# Patient Record
Sex: Male | Born: 1975 | Race: Black or African American | Hispanic: No | Marital: Single | Smoking: Current every day smoker
Health system: Southern US, Community
[De-identification: ages and names within clinical notes are randomized; demographics above are authoritative.]

## PROBLEM LIST (undated history)

## (undated) DIAGNOSIS — J45909 Unspecified asthma, uncomplicated: Secondary | ICD-10-CM

---

## 2015-04-17 DIAGNOSIS — J45909 Unspecified asthma, uncomplicated: Secondary | ICD-10-CM | POA: Insufficient documentation

## 2015-11-12 ENCOUNTER — Emergency Department (HOSPITAL_COMMUNITY)
Admission: EM | Admit: 2015-11-12 | Discharge: 2015-11-12 | Disposition: A | Discharge status: Home / Self Care | Payer: Self-pay | Attending: Emergency Medicine | Admitting: Emergency Medicine

## 2015-11-12 ENCOUNTER — Encounter (HOSPITAL_COMMUNITY): Payer: Self-pay | Admitting: *Deleted

## 2015-11-12 ENCOUNTER — Emergency Department (HOSPITAL_COMMUNITY): Payer: Self-pay

## 2015-11-12 DIAGNOSIS — W268XXA Contact with other sharp object(s), not elsewhere classified, initial encounter: Secondary | ICD-10-CM | POA: Insufficient documentation

## 2015-11-12 DIAGNOSIS — J45909 Unspecified asthma, uncomplicated: Secondary | ICD-10-CM | POA: Insufficient documentation

## 2015-11-12 DIAGNOSIS — S68129A Partial traumatic metacarpophalangeal amputation of unspecified finger, initial encounter: Secondary | ICD-10-CM

## 2015-11-12 DIAGNOSIS — Z79899 Other long term (current) drug therapy: Secondary | ICD-10-CM | POA: Insufficient documentation

## 2015-11-12 DIAGNOSIS — S68121A Partial traumatic metacarpophalangeal amputation of left index finger, initial encounter: Secondary | ICD-10-CM | POA: Insufficient documentation

## 2015-11-12 DIAGNOSIS — Y929 Unspecified place or not applicable: Secondary | ICD-10-CM | POA: Insufficient documentation

## 2015-11-12 DIAGNOSIS — S68119A Complete traumatic metacarpophalangeal amputation of unspecified finger, initial encounter: Secondary | ICD-10-CM

## 2015-11-12 DIAGNOSIS — Z23 Encounter for immunization: Secondary | ICD-10-CM | POA: Insufficient documentation

## 2015-11-12 DIAGNOSIS — F17210 Nicotine dependence, cigarettes, uncomplicated: Secondary | ICD-10-CM | POA: Insufficient documentation

## 2015-11-12 DIAGNOSIS — F1290 Cannabis use, unspecified, uncomplicated: Secondary | ICD-10-CM | POA: Insufficient documentation

## 2015-11-12 DIAGNOSIS — Y939 Activity, unspecified: Secondary | ICD-10-CM | POA: Insufficient documentation

## 2015-11-12 DIAGNOSIS — Y999 Unspecified external cause status: Secondary | ICD-10-CM | POA: Insufficient documentation

## 2015-11-12 HISTORY — DX: Unspecified asthma, uncomplicated: J45.909

## 2015-11-12 MED ORDER — FENTANYL CITRATE (PF) 100 MCG/2ML IJ SOLN
50.0000 ug | Freq: Once | INTRAMUSCULAR | Status: AC
  Administered 2015-11-12: 50 ug via INTRAMUSCULAR
  Filled 2015-11-12: qty 2

## 2015-11-12 MED ORDER — HYDROGEN PEROXIDE 3 % EX SOLN
CUTANEOUS | Status: AC
  Filled 2015-11-12: qty 473

## 2015-11-12 MED ORDER — IBUPROFEN 800 MG PO TABS: 800.0000 mg | ORAL_TABLET | Freq: Three times a day (TID) | ORAL | 0 refills | Status: AC

## 2015-11-12 MED ORDER — OXYCODONE-ACETAMINOPHEN 5-325 MG PO TABS: 1.0000 | ORAL_TABLET | Freq: Four times a day (QID) | ORAL | 0 refills | Status: DC | PRN

## 2015-11-12 MED ORDER — TETANUS-DIPHTH-ACELL PERTUSSIS 5-2.5-18.5 LF-MCG/0.5 IM SUSP
0.5000 mL | Freq: Once | INTRAMUSCULAR | Status: AC
  Administered 2015-11-12: 0.5 mL via INTRAMUSCULAR
  Filled 2015-11-12: qty 0.5

## 2015-11-12 NOTE — ED Notes (Signed)
Discharge instructions, follow up care, and rx x2 reviewed with patient. Patient verbalized understanding. 

## 2015-11-12 NOTE — ED Notes (Signed)
Patient's wound irrigated with normal saline

## 2015-11-12 NOTE — ED Triage Notes (Signed)
Pt reports getting his L index finger stuck in a metal broom holder-amputating the tip of his finger.

## 2015-11-12 NOTE — ED Notes (Signed)
Per PA, soak patient's finger in warm water and hydrogen peroxide.

## 2015-11-12 NOTE — ED Notes (Signed)
Xeroform dressing and gauze applied to wound.

## 2015-11-12 NOTE — ED Provider Notes (Signed)
[facility]-EMERGENCY DEPT Provider Note   CSN: [id number] Arrival date & time: 11/12/15  1015     History   Chief Complaint Chief Complaint  Patient presents with  . Finger Injury    HPI Jon Adkins is a 40 y.o. male.  HPI   Patient is a 30 abnormality past medical history of asthma, presents emergency department for evaluation of left index fingertip amputation which occurred less than one hour ago.  The injury occurred when his finger got stuck in a metal broom holder and he pulled it causing it to cut the tip of his finger off. The cut is through his nail. He wrapped it up and pepper towels and came to the ER. He does not know his last tetanus shot.  He reports severe pain, 10/10 worse with movement and palpation, Pain radiates into all of his fingers and up his arm to his elbow.  No medications or treatments prior to arrival.  He did bring the tip of his finger with fingernail into the ER and is placed on ice.  Past Medical History:  Diagnosis Date  . Asthma     There are no active problems to display for this patient.   History reviewed. No pertinent surgical history.     Home Medications    Prior to Admission medications   Medication Sig Start Date End Date Taking? Authorizing Provider  albuterol (PROVENTIL HFA;VENTOLIN HFA) 108 (90 Base) MCG/ACT inhaler Inhale 1-2 puffs into the lungs every 6 (six) hours as needed for wheezing or shortness of breath.   Yes Historical Provider, MD  ibuprofen (ADVIL,MOTRIN) 800 MG tablet Take 1 tablet (800 mg total) by mouth 3 (three) times daily. 11/12/15   Danelle BerryLeisa Leeah Politano, PA-C  oxyCODONE-acetaminophen (PERCOCET) 5-325 MG tablet Take 1-2 tablets by mouth every 6 (six) hours as needed for severe pain. 11/12/15   Danelle BerryLeisa Eduin Friedel, PA-C    Family History No family history on file.  Social History Social History  Substance Use Topics  . Smoking status: Current Every Day Smoker    Packs/day: 0.50    Types: Cigarettes  . Smokeless tobacco:  Never Used  . Alcohol use No     Allergies   Review of patient's allergies indicates no known allergies.   Review of Systems Review of Systems  All other systems reviewed and are negative.    Physical Exam Updated Vital Signs BP (!) 130/112 (BP Location: Right Arm)   Pulse 64   Temp 98.5 F (36.9 C) (Oral)   Resp 18   Ht 5\' 7"  (1.702 m)   Wt 90.7 kg   SpO2 100%   BMI 31.32 kg/m   Physical Exam  Constitutional: He is oriented to person, place, and time. He appears well-developed and well-nourished. No distress.  HENT:  Head: Normocephalic and atraumatic.  Right Ear: External ear normal.  Left Ear: External ear normal.  Nose: Nose normal.  Mouth/Throat: Oropharyngeal exudate present.  Eyes: Conjunctivae are normal. Pupils are equal, round, and reactive to light. Right eye exhibits no discharge. Left eye exhibits no discharge. No scleral icterus.  Neck: Normal range of motion. Neck supple. No tracheal deviation present.  Cardiovascular: Normal rate, regular rhythm and intact distal pulses.   Pulmonary/Chest: Effort normal and breath sounds normal. No stridor. No respiratory distress.  Musculoskeletal: Normal range of motion. He exhibits tenderness. He exhibits no edema.       Left hand: He exhibits tenderness and laceration. He exhibits normal range of motion. Normal sensation noted.  Hands: Neurological: He is alert and oriented to person, place, and time. He exhibits normal muscle tone. Coordination normal.  Skin: Skin is warm and dry. No rash noted. He is not diaphoretic. No erythema. No pallor.  Psychiatric: He has a normal mood and affect. His behavior is normal. Judgment and thought content normal.  Nursing note and vitals reviewed.      ED Treatments / Results  Labs (all labs ordered are listed, but only abnormal results are displayed) Labs Reviewed - No data to display  EKG  EKG Interpretation None       Radiology Dg Finger Index  Left  Result Date: 11/12/2015 CLINICAL DATA:  Index finger pain following injury. Initial encounter. EXAM: LEFT INDEX FINGER 2+V COMPARISON:  None. FINDINGS: Degloving soft tissue injury noted distally, best seen on the lateral view. The distal phalanx appears exposed without definite fracture or bone destruction. There is no evidence of foreign body or soft tissue emphysema. IMPRESSION: Distal soft tissue degloving injury with suspected osseous exposure, predisposing to infection. No acute osseous findings evident. Electronically Signed   By: Carey BullocksWilliam  Veazey M.D.   On: 11/12/2015 10:46    Procedures Procedures (including critical care time)  Medications Ordered in ED Medications  hydrogen peroxide 3 % external solution (not administered)  fentaNYL (SUBLIMAZE) injection 50 mcg (50 mcg Intramuscular Given 11/12/15 1041)  Tdap (BOOSTRIX) injection 0.5 mL (0.5 mLs Intramuscular Given 11/12/15 1044)     Initial Impression / Assessment and Plan / ED Course  I have reviewed the triage vital signs and the nursing notes.  Pertinent labs & imaging results that were available during my care of the patient were reviewed by me and considered in my medical decision making (see chart for details).  Clinical Course   Patient with distal left index fingertip avulsion laceration (tip amputation) with partial nailbed involvement.  Case was discussed with Dr. Adela LankFloyd who has personally seen and evaluated the pt.  He agrees with secondary intention wound healing.  Did not see visible bone.  Xeroform dressing with gauze was applied.  Wound care was thoroughly reviewed with the pt, return precautions reviewed, pt verbalized understanding.  He will return for wound recheck.    Final Clinical Impressions(s) / ED Diagnoses   Final diagnoses:  Fingertip amputation, initial encounter    New Prescriptions Discharge Medication List as of 11/12/2015 12:00 PM    START taking these medications   Details  ibuprofen  (ADVIL,MOTRIN) 800 MG tablet Take 1 tablet (800 mg total) by mouth 3 (three) times daily., Starting Mon 11/12/2015, Print    oxyCODONE-acetaminophen (PERCOCET) 5-325 MG tablet Take 1-2 tablets by mouth every 6 (six) hours as needed for severe pain., Starting Mon 11/12/2015, Print         Danelle BerryLeisa Sahas Sluka, PA-C 11/12/15 1248    Melene Planan Floyd, DO 11/12/15 [phone]

## 2016-08-15 ENCOUNTER — Emergency Department (HOSPITAL_COMMUNITY)
Admission: EM | Admit: 2016-08-15 | Discharge: 2016-08-15 | Disposition: A | Discharge status: Home / Self Care | Payer: 59 | Attending: Emergency Medicine | Admitting: Emergency Medicine

## 2016-08-15 ENCOUNTER — Encounter (HOSPITAL_COMMUNITY): Payer: Self-pay

## 2016-08-15 DIAGNOSIS — J020 Streptococcal pharyngitis: Secondary | ICD-10-CM | POA: Diagnosis not present

## 2016-08-15 DIAGNOSIS — J45909 Unspecified asthma, uncomplicated: Secondary | ICD-10-CM | POA: Insufficient documentation

## 2016-08-15 DIAGNOSIS — J029 Acute pharyngitis, unspecified: Secondary | ICD-10-CM | POA: Diagnosis present

## 2016-08-15 DIAGNOSIS — F17200 Nicotine dependence, unspecified, uncomplicated: Secondary | ICD-10-CM | POA: Insufficient documentation

## 2016-08-15 LAB — RAPID STREP SCREEN (MED CTR MEBANE ONLY): STREPTOCOCCUS, GROUP A SCREEN (DIRECT): POSITIVE — AB

## 2016-08-15 MED ORDER — PENICILLIN G BENZATHINE 1200000 UNIT/2ML IM SUSP
1.2000 10*6.[IU] | Freq: Once | INTRAMUSCULAR | Status: AC
  Administered 2016-08-15: 1.2 10*6.[IU] via INTRAMUSCULAR
  Filled 2016-08-15: qty 2

## 2016-08-15 MED ORDER — IBUPROFEN 400 MG PO TABS
600.0000 mg | ORAL_TABLET | Freq: Once | ORAL | Status: AC
  Administered 2016-08-15: 600 mg via ORAL
  Filled 2016-08-15: qty 1

## 2016-08-15 MED ORDER — ACETAMINOPHEN 500 MG PO TABS
ORAL_TABLET | ORAL | Status: AC
  Filled 2016-08-15: qty 1

## 2016-08-15 MED ORDER — ACETAMINOPHEN 325 MG PO TABS
650.0000 mg | ORAL_TABLET | Freq: Once | ORAL | Status: AC | PRN
  Administered 2016-08-15: 650 mg via ORAL

## 2016-08-15 MED ORDER — DEXAMETHASONE SODIUM PHOSPHATE 10 MG/ML IJ SOLN
10.0000 mg | Freq: Once | INTRAMUSCULAR | Status: AC
  Administered 2016-08-15: 10 mg via INTRAMUSCULAR
  Filled 2016-08-15: qty 1

## 2016-08-15 MED ORDER — ACETAMINOPHEN 325 MG PO TABS
ORAL_TABLET | ORAL | Status: AC
  Filled 2016-08-15: qty 2

## 2016-08-15 MED ORDER — ALBUTEROL SULFATE HFA 108 (90 BASE) MCG/ACT IN AERS: 1.0000 | INHALATION_SPRAY | Freq: Four times a day (QID) | RESPIRATORY_TRACT | 0 refills | Status: AC | PRN

## 2016-08-15 MED ORDER — MAGIC MOUTHWASH W/LIDOCAINE: 10.0000 mL | Freq: Four times a day (QID) | ORAL | 0 refills | Status: AC | PRN

## 2016-08-15 NOTE — Discharge Instructions (Signed)
Rest and make sure you are drinking plenty of fluids.  Continue taking ibuprofen or tylenol for fever reduction and sore throat pain. Soft foods until your throat feels better.  You may use the magic mouthwash for temporary numbing of your tonsils which should also help you eat easier.

## 2016-08-15 NOTE — ED Provider Notes (Signed)
[facility]-EMERGENCY DEPT Provider Note   CSN: [id number] Arrival date & time: 08/15/16  1105     History   Chief Complaint Chief Complaint  Patient presents with  . Sore Throat    HPI Jon Adkins is a 41 y.o. male presenting with a 2 day history of sore throat, lymph node swelling,  Headache, generalized body aches and subjective fevers intermittent with chills. He has taken a generic nyquill type product which has helped him sleep the past 2 nights.  He describes poor oral intake x 2 days secondary to painful swallowing.  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 He denies abdominal pain, n/v/d, cough, sob, wheezing, no chest pain.  The history is provided by the patient and a parent.    Past Medical History:  Diagnosis Date  . Asthma     There are no active problems to display for this patient.   History reviewed. No pertinent surgical history.     Home Medications    Prior to Admission medications   Medication Sig Start Date End Date Taking? Authorizing Provider  albuterol (PROVENTIL HFA;VENTOLIN HFA) 108 (90 Base) MCG/ACT inhaler Inhale 1-2 puffs into the lungs every 6 (six) hours as needed for wheezing or shortness of breath.    [provider]  ibuprofen (ADVIL,MOTRIN) 800 MG tablet Take 1 tablet (800 mg total) by mouth 3 (three) times daily. 11/12/15   Danelle Berry, PA-C  magic mouthwash w/lidocaine SOLN Take 10 mLs by mouth 4 (four) times daily as needed (throat pain). 08/15/16   Burgess Amor, PA-C  oxyCODONE-acetaminophen (PERCOCET) 5-325 MG tablet Take 1-2 tablets by mouth every 6 (six) hours as needed for severe pain. 11/12/15   Danelle Berry, PA-C    Family History No family history on file.  Social History Social History  Substance Use Topics  . Smoking status: Current Every Day Smoker    Packs/day: 0.50    Types: Cigarettes  . Smokeless tobacco: Never Used  . Alcohol use No     Allergies   Patient has no  known allergies.   Review of Systems Review of Systems  Constitutional: Positive for chills and fever.  HENT: Positive for sore throat. Negative for congestion, ear pain, rhinorrhea, sinus pressure, trouble swallowing and voice change.   Eyes: Negative for discharge.  Respiratory: Negative for cough, shortness of breath, wheezing and stridor.   Cardiovascular: Negative for chest pain.  Gastrointestinal: Negative for abdominal pain, nausea and vomiting.  Genitourinary: Negative.   Neurological: Positive for headaches. Negative for light-headedness.     Physical Exam Updated Vital Signs BP (!) 152/104 (BP Location: Left Arm)   Pulse (!) 102   Temp (!) 100.6 F (38.1 C) (Oral)   Resp 18   Ht 5\' 7"  (1.702 m)   Wt 200 lb (90.7 kg)   SpO2 100%   BMI 31.32 kg/m   Physical Exam  Constitutional: He is oriented to person, place, and time. He appears well-developed and well-nourished.  HENT:  Head: Normocephalic and atraumatic.  Right Ear: Tympanic membrane and ear canal normal.  Left Ear: Tympanic membrane and ear canal normal.  Nose: No mucosal edema or rhinorrhea.  Mouth/Throat: Uvula is midline and mucous membranes are normal. No trismus in the jaw. No uvula swelling. Oropharyngeal exudate, posterior oropharyngeal edema and posterior oropharyngeal erythema present. No tonsillar abscesses. Tonsils are 2+ on the right. Tonsils are 2+ on the left. Tonsillar exudate.  Bilateral tonsillar hypertrophy without  evidence of peritonsillar abscess.  White patches on tonsil, erythema.  Uvula midline.  Eyes: Conjunctivae are normal.  Neck: Normal range of motion. Neck supple.  Cardiovascular: Normal rate and normal heart sounds.   Pulmonary/Chest: Effort normal. No respiratory distress. He has no wheezes. He has no rales.  Abdominal: Soft. There is no tenderness. There is no guarding.  Musculoskeletal: Normal range of motion.  Lymphadenopathy:       Head (right side): Tonsillar adenopathy  present.       Head (left side): Tonsillar adenopathy present.  Neurological: He is alert and oriented to person, place, and time.  Skin: Skin is warm and dry. No rash noted.  Psychiatric: He has a normal mood and affect.     ED Treatments / Results  Labs (all labs ordered are listed, but only abnormal results are displayed) Labs Reviewed  RAPID STREP SCREEN (NOT [facility]) - Abnormal; Notable for the following:       Result Value   Streptococcus, Group A Screen (Direct) POSITIVE (*)    All other components within normal limits    EKG  EKG Interpretation None       Radiology No results found.  Procedures Procedures (including critical care time)  Medications Ordered in ED Medications  acetaminophen (TYLENOL) 325 MG tablet (not administered)  dexamethasone (DECADRON) injection 10 mg (not administered)  ibuprofen (ADVIL,MOTRIN) tablet 600 mg (not administered)  penicillin g benzathine (BICILLIN LA) 1200000 UNIT/2ML injection 1.2 Million Units (not administered)  acetaminophen (TYLENOL) tablet 650 mg (650 mg Oral Given 08/15/16 1116)     Initial Impression / Assessment and Plan / ED Course  I have reviewed the triage vital signs and the nursing notes.  Pertinent labs & imaging results that were available during my care of the patient were reviewed by me and considered in my medical decision making (see chart for details).     Bicillin LA, decadron given here.  Encouraged rest, increased fluid intake, continued tylenol and/or motrin at home for pain and fever relief.  PRN f/u given.   Final Clinical Impressions(s) / ED Diagnoses   Final diagnoses:  Strep throat    New Prescriptions New Prescriptions   MAGIC MOUTHWASH W/LIDOCAINE SOLN    Take 10 mLs by mouth 4 (four) times daily as needed (throat pain).     Burgess Amordol, Ilena Dieckman, PA-C 08/15/16 1219    After discharge patient requested a refill of his albuterol as he is almost out of his medicine.  He was given a  refill.  He is not wheezing during today's exam.   Burgess Amordol, Naomee Nowland, PA-C 08/15/16 1254    Charlynne PanderYao, David Hsienta, MD 08/16/16 [phone]

## 2016-08-15 NOTE — ED Triage Notes (Signed)
Per Pt, Pt is coming from home with sore throat, body aches, and chills since Wednesday. Pt was noted to have a fever today. Pt took OTC medication at night with some relief.

## 2020-02-08 DIAGNOSIS — Z720 Tobacco use: Secondary | ICD-10-CM | POA: Insufficient documentation

## 2020-05-30 DIAGNOSIS — E782 Mixed hyperlipidemia: Secondary | ICD-10-CM | POA: Insufficient documentation

## 2020-07-18 ENCOUNTER — Other Ambulatory Visit: Payer: Self-pay

## 2020-07-18 ENCOUNTER — Encounter: Payer: Self-pay | Admitting: Physician Assistant

## 2020-07-18 ENCOUNTER — Ambulatory Visit: Payer: Self-pay | Admitting: Physician Assistant

## 2020-07-18 DIAGNOSIS — Z299 Encounter for prophylactic measures, unspecified: Secondary | ICD-10-CM

## 2020-07-18 DIAGNOSIS — Z113 Encounter for screening for infections with a predominantly sexual mode of transmission: Secondary | ICD-10-CM

## 2020-07-18 DIAGNOSIS — E119 Type 2 diabetes mellitus without complications: Secondary | ICD-10-CM

## 2020-07-18 LAB — HM HIV SCREENING LAB: HM HIV Screening: NEGATIVE

## 2020-07-18 LAB — HEPATITIS B SURFACE ANTIGEN

## 2020-07-18 LAB — GRAM STAIN

## 2020-07-18 LAB — HM HEPATITIS C SCREENING LAB: HM Hepatitis Screen: NEGATIVE

## 2020-07-18 MED ORDER — DOXYCYCLINE HYCLATE 100 MG PO TABS: 100.0000 mg | ORAL_TABLET | Freq: Two times a day (BID) | ORAL | 0 refills | Status: AC

## 2020-07-18 NOTE — Progress Notes (Signed)
[facility] Department STI clinic/screening visit  Subjective:  Jon Adkins is a 45 y.o. male being seen today for an STI screening visit. The patient reports they do have symptoms.    Patient has the following medical conditions:   Patient Active Problem List   Diagnosis Date Noted  . Asthma 04/17/2015     Chief Complaint  Patient presents with  . SEXUALLY TRANSMITTED DISEASE    screening    HPI  Patient reports that he has had penile discharge off and on for 1-2 weeks, a sharp pain in right groin off and on, and "fine bumps" that have now resolved last week on the penis.  Denies other symptoms.  Reports that he has DM and Asthma and takes 2 pills for DM, 1 pill for cholesterol and has an inhaler.  States that he does not know the names of the pills that he takes.  Reports that he sees his PCP regularly.  Last HIV test was years ago and last void prior to sample collection for Gram stain and GC/Chlamydia urine sample was about 1 hr ago.     See flowsheet for further details and programmatic requirements.    The following portions of the patient's history were reviewed and updated as appropriate: allergies, current medications, past medical history, past social history, past surgical history and problem list.  Objective:  There were no vitals filed for this visit.  Physical Exam Constitutional:      General: He is not in acute distress.    Appearance: Normal appearance.  HENT:     Head: Normocephalic and atraumatic.     Comments: No nits,lice, or hair loss. No cervical, supraclavicular or axillary adenopathy.    Mouth/Throat:     Mouth: Mucous membranes are moist.     Pharynx: Oropharynx is clear. No oropharyngeal exudate or posterior oropharyngeal erythema.  Eyes:     Conjunctiva/sclera: Conjunctivae normal.  Pulmonary:     Effort: Pulmonary effort is normal.  Abdominal:     Palpations: Abdomen is soft. There is no mass.     Tenderness: There is no  abdominal tenderness. There is no guarding or rebound.  Genitourinary:    Penis: Normal.      Testes: Normal.     Comments: Pubic area without nits, lice, hair loss, edema, erythema, lesions and inguinal adenopathy. Penis circumcised without rash, lesions and discharge at meatus. Testicles descended bilaterally,nt, no masses or edema. Musculoskeletal:     Cervical back: Neck supple. No tenderness.  Skin:    General: Skin is warm and dry.     Findings: No bruising, erythema, lesion or rash.  Neurological:     Mental Status: He is alert and oriented to person, place, and time.  Psychiatric:        Mood and Affect: Mood normal.        Behavior: Behavior normal.        Thought Content: Thought content normal.        Judgment: Judgment normal.       Assessment and Plan:  Jon Adkins is a 45 y.o. male presenting to the [facility] Department for STI screening  1. Screening for STD (sexually transmitted disease) Patient into clinic with symptoms. Reviewed with patient Gram stain results and that due to recent urination prior to sample collection the Gram stain could be not as accurate. Rec condoms with all sex. Await test results.  Counseled that RN will call if needs to RTC for  treatment once results are back. - Gram stain - Chlamydia/Gonorrhea [place] [facility] - HBV Antigen/Antibody [facility] - HIV/HCV [facility] - Syphilis Serology, [facility]  2. Prophylactic measure Counseled patient re: Gram stain results and on waiting for the GC/Chlamydia results.   Due to patient having symptoms, a new partner and not using condoms will treat to cover for NGU with Doxycycline 100 mg #14 1 po BID for 7 days. No sex for 14 days and until after partner is screened and treated. Enc patient to follow up with PCP if symptoms persist or worsen after treatment. - doxycycline (VIBRA-TABS) 100 MG tablet; Take 1 tablet (100 mg total) by mouth 2 (two) times daily for 7 days.  Dispense:  14 tablet; Refill: 0     No follow-ups on file.  No future appointments.  Matt Holmes, PA

## 2020-07-21 NOTE — Progress Notes (Signed)
Chart reviewed by Pharmacist  Suzanne Walker PharmD, Contract Pharmacist at [facility]  

## 2020-12-23 ENCOUNTER — Emergency Department
Admission: EM | Admit: 2020-12-23 | Discharge: 2020-12-23 | Disposition: A | Discharge status: Home / Self Care | Payer: No Typology Code available for payment source | Attending: Emergency Medicine | Admitting: Emergency Medicine

## 2020-12-23 ENCOUNTER — Encounter: Payer: Self-pay | Admitting: Emergency Medicine

## 2020-12-23 ENCOUNTER — Other Ambulatory Visit: Payer: Self-pay

## 2020-12-23 ENCOUNTER — Emergency Department: Payer: No Typology Code available for payment source

## 2020-12-23 DIAGNOSIS — E119 Type 2 diabetes mellitus without complications: Secondary | ICD-10-CM | POA: Diagnosis not present

## 2020-12-23 DIAGNOSIS — F17210 Nicotine dependence, cigarettes, uncomplicated: Secondary | ICD-10-CM | POA: Insufficient documentation

## 2020-12-23 DIAGNOSIS — T1490XA Injury, unspecified, initial encounter: Secondary | ICD-10-CM

## 2020-12-23 DIAGNOSIS — S92422B Displaced fracture of distal phalanx of left great toe, initial encounter for open fracture: Secondary | ICD-10-CM | POA: Diagnosis not present

## 2020-12-23 DIAGNOSIS — Z23 Encounter for immunization: Secondary | ICD-10-CM | POA: Diagnosis not present

## 2020-12-23 DIAGNOSIS — S91212A Laceration without foreign body of left great toe with damage to nail, initial encounter: Secondary | ICD-10-CM | POA: Diagnosis present

## 2020-12-23 DIAGNOSIS — J45909 Unspecified asthma, uncomplicated: Secondary | ICD-10-CM | POA: Diagnosis not present

## 2020-12-23 DIAGNOSIS — S91209A Unspecified open wound of unspecified toe(s) with damage to nail, initial encounter: Secondary | ICD-10-CM

## 2020-12-23 DIAGNOSIS — Y990 Civilian activity done for income or pay: Secondary | ICD-10-CM | POA: Diagnosis not present

## 2020-12-23 MED ORDER — TETANUS-DIPHTH-ACELL PERTUSSIS 5-2.5-18.5 LF-MCG/0.5 IM SUSY
0.5000 mL | PREFILLED_SYRINGE | Freq: Once | INTRAMUSCULAR | Status: AC
  Administered 2020-12-23: 0.5 mL via INTRAMUSCULAR
  Filled 2020-12-23: qty 0.5

## 2020-12-23 MED ORDER — TRAMADOL HCL 50 MG PO TABS: 50.0000 mg | ORAL_TABLET | Freq: Three times a day (TID) | ORAL | 0 refills | Status: AC | PRN

## 2020-12-23 MED ORDER — CEPHALEXIN 500 MG PO CAPS: 500.0000 mg | ORAL_CAPSULE | Freq: Three times a day (TID) | ORAL | 0 refills | Status: DC

## 2020-12-23 MED ORDER — LIDOCAINE HCL (PF) 1 % IJ SOLN
30.0000 mL | Freq: Once | INTRAMUSCULAR | Status: AC
  Administered 2020-12-23: 30 mL
  Filled 2020-12-23 (×2): qty 30

## 2020-12-23 MED ORDER — CEFAZOLIN SODIUM-DEXTROSE 1-4 GM/50ML-% IV SOLN
1.0000 g | Freq: Once | INTRAVENOUS | Status: AC
  Administered 2020-12-23: 1 g via INTRAVENOUS
  Filled 2020-12-23: qty 50

## 2020-12-23 NOTE — ED Triage Notes (Signed)
Pt works for [employer]. [phone] is the number for the worker's comp line.

## 2020-12-23 NOTE — ED Triage Notes (Signed)
Pt to ED POV c/o from foot injury from work that started around 4:15 this morning. Pt states he works for a [employer] and while he was at work a Neurosurgeon ran over his L Big toe. Pt has laceration across L big toe.  Pt is able to move foot and can feel all the way down to injury.  Pt A&Ox4, NAD

## 2020-12-23 NOTE — ED Provider Notes (Signed)
[facility] Emergency Department Provider Note ____________________________________________  Time seen: 0820  I have reviewed the triage vital signs and the nursing notes.  HISTORY  Chief Complaint  Foot Injury   HPI Jon Adkins is a 45 y.o. male presents to the ER today with complaint of laceration to his left great toe.  He reports he was at work this morning when a pallet jack ran over his foot.  He was able to control the bleeding.  He describes the pain as throbbing.  He has been able to ambulate since the incident.  He is unsure the last time he had a tetanus vaccine.  He reports he is diabetic, last A1c 7.7%, 10/2020.  He did not take any medications or clean the wound prior to arrival.  Past Medical History:  Diagnosis Date   Asthma     Patient Active Problem List   Diagnosis Date Noted   Diabetes mellitus without complication (HCC) 07/18/2020   Asthma 04/17/2015    History reviewed. No pertinent surgical history.  Prior to Admission medications   Medication Sig Start Date End Date Taking? Authorizing Provider  cephALEXin (KEFLEX) 500 MG capsule Take 1 capsule (500 mg total) by mouth 3 (three) times daily. 12/23/20  Yes Brenyn Petrey, Salvadore Oxford, NP  traMADol (ULTRAM) 50 MG tablet Take 1 tablet (50 mg total) by mouth every 8 (eight) hours as needed. 12/23/20 12/23/21 Yes Faiz Weber, Salvadore Oxford, NP  albuterol (PROVENTIL HFA;VENTOLIN HFA) 108 (90 Base) MCG/ACT inhaler Inhale 1-2 puffs into the lungs every 6 (six) hours as needed for wheezing or shortness of breath. 08/15/16   Burgess Amor, PA-C  ibuprofen (ADVIL,MOTRIN) 800 MG tablet Take 1 tablet (800 mg total) by mouth 3 (three) times daily. Patient not taking: Reported on 07/18/2020 11/12/15   Danelle Berry, PA-C  magic mouthwash w/lidocaine SOLN Take 10 mLs by mouth 4 (four) times daily as needed (throat pain). Patient not taking: Reported on 07/18/2020 08/15/16   Burgess Amor, PA-C    Allergies Patient has no known  allergies.  History reviewed. No pertinent family history.  Social History Social History   Tobacco Use   Smoking status: Every Day    Packs/day: 0.50    Types: Cigarettes   Smokeless tobacco: Never  Substance Use Topics   Alcohol use: No   Drug use: Yes    Types: Marijuana    Review of Systems  Constitutional: Negative for fever, chills or body aches. Cardiovascular: Negative for chest pain or chest tighness. Respiratory: Negative for cough or shortness of breath. Musculoskeletal: Positive for left great toe pain.  Negative for difficulty with gait. Skin: Positive for laceration of left great toe. Neurological: Negative for focal weakness, tingling or numbness. ____________________________________________  PHYSICAL EXAM:  VITAL SIGNS: ED Triage Vitals  Enc Vitals Group     BP 12/23/20 0735 139/84     Pulse Rate 12/23/20 0735 96     Resp 12/23/20 0735 18     Temp 12/23/20 0735 98.2 F (36.8 C)     Temp Source 12/23/20 0735 Oral     SpO2 12/23/20 0735 96 %     Weight --      Height --      Head Circumference --      Peak Flow --      Pain Score 12/23/20 0746 10     Pain Loc --      Pain Edu? --      Excl. in GC? --  Constitutional: Alert and oriented.  Appears uncomfortable but in no distress. Head: Normocephalic. Cardiovascular: Normal rate, regular rhythm.  Pedal pulses 2+ bilaterally. Respiratory: Normal respiratory effort. No wheezes/rales/rhonchi. Musculoskeletal: Normal passive flexion and extension of the left great toe.  No swelling noted of the toe.  He has almost complete loss of the left great toenail. Neurologic:  Normal speech and language. No gross focal neurologic deficits are appreciated. Skin: No distinct laceration noted but loosening of the left great toenail noted.  ____________________________________________  RADIOLOGY Imaging Orders         DG Foot Complete Left    IMPRESSION: 1. Comminuted mildly displaced fracture of the distal  tuft of the left great toe associated with a soft tissue injury. No radiopaque foreign body.  ____________________________________________  PROCEDURES  .Nail Removal  Date/Time: 12/23/2020 10:21 AM Performed by: [name], NP Authorized by: [name], NP   Consent:    Consent obtained:  Verbal   Consent given by:  Patient   Risks, benefits, and alternatives were discussed: yes     Risks discussed:  Bleeding, infection, pain and permanent nail deformity   Alternatives discussed:  No treatment Universal protocol:    Patient identity confirmed:  Verbally with patient and arm band Location:    Foot:  L big toe Pre-procedure details:    Skin preparation:  Povidone-iodine   Preparation: Patient was prepped and draped in the usual sterile fashion   Anesthesia:    Anesthesia method:  Nerve block   Block needle gauge:  24 G   Block anesthetic:  Lidocaine 1% w/o epi   Block injection procedure:  Anatomic landmarks identified, introduced needle, incremental injection, anatomic landmarks palpated and negative aspiration for blood   Block outcome:  Anesthesia achieved Nail Removal:    Nail removed:  Complete   Nail bed repaired: yes     Nail bed repair material:  5-0 vicryl   Number of sutures:  4 Trephination:    Subungual hematoma drained: no   Ingrown nail:    Wedge excision of skin: yes     Nail matrix removed or ablated:  None Post-procedure details:    Dressing:  Petrolatum-impregnated gauze and post-op shoe   Procedure completion:  Tolerated well, no immediate complications  ____________________________________________  INITIAL IMPRESSION / ASSESSMENT AND PLAN / ED COURSE  Traumatic Avulsion of the Left Great Toenail:  Xray left great toe c/w comminuted displaced fracture of the distal tuft of the left great toe Nail removed- see procedure note Podiatry paged- Dr. Ardelle Anton will come evaluate patient Recommend Ancef 1 gm IV x 1 Placed in post op shoe Tdap  given RX for Tramadol 50 mg Q8H prn RX for Keflex 500 mg TID x 7 days Will have him follow up with podiatry as an outpatient      I reviewed the patient's prescription history over the last 12 months in the multi-state controlled substances database(s) that includes [place], [place], [place], [place], [place], [place], [place], [place], [place], [place], [place], [place], [place], and [place].  Results were notable for Hydromet 09/19/20. ____________________________________________  FINAL CLINICAL IMPRESSION(S) / ED DIAGNOSES  Final diagnoses:  Injury  Avulsion of toenail of left foot  Open displaced fracture of distal phalanx of left great toe, initial encounter      [name], NP 12/23/20 1258    Merwyn Katos, MD 12/23/20 1410

## 2020-12-23 NOTE — Consult Note (Signed)
Reason for Consult: Open toe fracture Referring Physician: Donna Bernard, MD  Jon Adkins is an 45 y.o. male.  HPI: 45 year old male presents to the emergency department after a pallet jack dropped on his left foot earlier this morning.  He is brought to emergency department plan of comminuted fracture of distal toe.  Emergency department repaired open laceration and I was contacted for planning.  I was in the hospital and came by to see the patient as well.  Having some mild discomfort as the numbing medication is worn off.  He is diabetic and he does smoke daily.  Past Medical History:  Diagnosis Date   Asthma     History reviewed. No pertinent surgical history.  History reviewed. No pertinent family history.  Social History:  reports that he has been smoking cigarettes. He has been smoking an average of .5 packs per day. He has never used smokeless tobacco. He reports current drug use. Drug: Marijuana. He reports that he does not drink alcohol.  Allergies: No Known Allergies  Medications: I have reviewed the patient's current medications.  No results found for this or any previous visit (from the past 48 hour(s)).  DG Foot Complete Left  Result Date: 12/23/2020 CLINICAL DATA:  Pt to ED POV c/o from foot injury from work that happened around 4:15 this morning. Pt states he works for a [employer] and while he was at work a Neurosurgeon ran over his L Big toe. Pt has laceration across L big toe. Pt is able to move foot and can feel all the way down to injury.Pt is diabetic EXAM: LEFT FOOT - COMPLETE 3 VIEW COMPARISON:  None. FINDINGS: Comminuted, mildly displaced fracture from the dorsal lateral margin of the distal tuft of the great toe, with an overlying soft tissue injury. No other fractures.  No bone lesions. Joints are normally spaced and aligned. No radiopaque foreign bodies. IMPRESSION: 1. Comminuted mildly displaced fracture of the distal tuft of the left great toe associated  with a soft tissue injury. No radiopaque foreign body. Electronically Signed   By: Amie Portland M.D.   On: 12/23/2020 08:33    Review of Systems Blood pressure 139/84, pulse 96, temperature 98.2 F (36.8 C), temperature source Oral, resp. rate 18, SpO2 96 %. Physical Exam General: AAO x3, NAD  Dermatological: Laceration has been repaired in the left hallux.  Dried blood is present there is no exposed bone.  Mild edema.  No other open lesions.  No evidence of compartment syndrome.  Vascular: Dorsalis Pedis artery and Posterior Tibial artery pedal pulses are 2/4 bilateral with immedate capillary fill time. There is no pain with calf compression, swelling, warmth, erythema.   Neruologic: Grossly intact via light touch bilateral.   Musculoskeletal: Mild tenderness to the left hallux today.  Mild diffuse tenderness of the foot but flexor, extensor tendons appear to be intact.   Assessment/Plan: Open fracture left hallux  I reviewed the x-rays and I reviewed this with the patient as well.  The lacerations of already been repaired by the emergency department.  I did further clean the wound and a new dressing was applied.  Recommended a dose of Ancef prior to discharge and discharge with cephalexin, surgical shoe.  Discussed dressing changes with the patient as well.  We will follow-up with him in the office later this week.  Monitor for any signs or symptoms of infection return precautions advised.  Vivi Barrack 12/23/2020, 11:27 AM

## 2020-12-23 NOTE — ED Notes (Signed)
Pt on phone with company representative, [employer].

## 2020-12-23 NOTE — ED Notes (Signed)
This tech explained to pt that due to not having the appropriate chain of custody form and unable to find his WC profile in our system, we are unable to perform a WC drug test at this facility. Pt is aware if he is able to obtain this information before discharge, we will continue w/ the process of completing a WC drug test.  Jon Adkins is aware.  An industrial drug screen ineligibility criteria was given to [facility] and it will be sign by RN and a copy will be provided to patient.

## 2020-12-23 NOTE — ED Notes (Signed)
Pt attempted to call manager to obtain [facility] information. Pt was unable to reach manager at this time. This tech informed pt that we would not be able to do a WC due do not having a profile in our system.

## 2020-12-23 NOTE — Discharge Instructions (Addendum)
You were seen today for toenail avulsion with a toe fracture.  You were evaluated by podiatry.  I placed you on oral antibiotics 3 times daily for the next 7 days.  I am giving you pain medication to take every 8 hours as needed for severe pain.  Please follow-up with podiatry as requested

## 2020-12-25 ENCOUNTER — Telehealth: Payer: Self-pay | Admitting: *Deleted

## 2020-12-25 NOTE — Telephone Encounter (Signed)
Patient is calling to schedule an appointment but wanted to know if workman's comp.has sent his information for visit. Please call.

## 2021-01-02 ENCOUNTER — Encounter: Payer: Self-pay | Admitting: Podiatry

## 2021-01-02 ENCOUNTER — Other Ambulatory Visit: Payer: Self-pay

## 2021-01-02 ENCOUNTER — Ambulatory Visit (INDEPENDENT_AMBULATORY_CARE_PROVIDER_SITE_OTHER): Payer: No Typology Code available for payment source | Admitting: Podiatry

## 2021-01-02 DIAGNOSIS — S91212A Laceration without foreign body of left great toe with damage to nail, initial encounter: Secondary | ICD-10-CM | POA: Diagnosis not present

## 2021-01-02 DIAGNOSIS — S92422B Displaced fracture of distal phalanx of left great toe, initial encounter for open fracture: Secondary | ICD-10-CM

## 2021-01-02 MED ORDER — CEPHALEXIN 500 MG PO CAPS: 500.0000 mg | ORAL_CAPSULE | Freq: Three times a day (TID) | ORAL | 0 refills | Status: AC

## 2021-01-02 MED ORDER — CEPHALEXIN 500 MG PO CAPS: 500.0000 mg | ORAL_CAPSULE | Freq: Three times a day (TID) | ORAL | 0 refills | Status: DC

## 2021-01-02 MED ORDER — MUPIROCIN 2 % EX OINT: 1.0000 "application " | TOPICAL_OINTMENT | Freq: Every day | CUTANEOUS | 2 refills | Status: DC

## 2021-01-03 ENCOUNTER — Encounter: Payer: Self-pay | Admitting: Podiatry

## 2021-01-03 ENCOUNTER — Telehealth: Payer: Self-pay | Admitting: Podiatry

## 2021-01-03 NOTE — Telephone Encounter (Signed)
Victorino Dike from [employer] which is the medical for [employer]. This patient was a workmans comp new case yesterday. Victorino Dike states they need to know the status of if the patient is gonna be out of work and how long. Please fax to her at [phone]. Her phone number is [phone].

## 2021-01-03 NOTE — Telephone Encounter (Signed)
Per Dr Lilian Kapur faxed an work note over to [place] for pt to be out for a month.

## 2021-01-07 ENCOUNTER — Encounter: Payer: Self-pay | Admitting: Podiatry

## 2021-01-07 NOTE — Progress Notes (Signed)
  Subjective:  Patient ID: Jon Adkins, male    DOB: 1975-11-15,  MRN: [id number]  Chief Complaint  Patient presents with   Toe Injury    Patient ran over left hallux with pallet jack on 12/25/20. He was seen at [facility], xray done, fracture and complete nail avulsion.  He was given Cephalexin.    He states that it aches and is still very sore and painful with pressure.      45 y.o. male presents with the above complaint. History confirmed with patient.   Objective:  Physical Exam: warm, good capillary refill, no trophic changes or ulcerative lesions, normal DP and PT pulses, and normal sensory exam. Left Foot: Nailbed healing well there are sutures intact that repair of the laceration of the distal hallux   Radiographs: I reviewed his radiographs from the emergency room there is avulsion of the distal tuft of the distal phalanx Assessment:   1. Open displaced fracture of distal phalanx of left great toe, initial encounter   2. Laceration of left great toe with damage to nail, foreign body presence unspecified, initial encounter      Plan:  Patient was evaluated and treated and all questions answered.  Continue Keflex for now.  He was only on 1 week.  Appears to be healing well without signs of infection.  Mupirocin prescribed and he will change this daily.  Plan remove sutures at next visit and hopefully will heal uneventfully thereafter  Return in about 2 weeks (around 01/16/2021) for wound care, suture removal.

## 2021-01-08 DIAGNOSIS — M79676 Pain in unspecified toe(s): Secondary | ICD-10-CM

## 2021-01-11 ENCOUNTER — Encounter: Payer: Self-pay | Admitting: Podiatry

## 2021-01-16 ENCOUNTER — Other Ambulatory Visit: Payer: Self-pay

## 2021-01-16 ENCOUNTER — Ambulatory Visit (INDEPENDENT_AMBULATORY_CARE_PROVIDER_SITE_OTHER): Payer: No Typology Code available for payment source | Admitting: Podiatry

## 2021-01-16 ENCOUNTER — Encounter: Payer: Self-pay | Admitting: Podiatry

## 2021-01-16 DIAGNOSIS — S92422B Displaced fracture of distal phalanx of left great toe, initial encounter for open fracture: Secondary | ICD-10-CM | POA: Diagnosis not present

## 2021-01-16 DIAGNOSIS — S91212A Laceration without foreign body of left great toe with damage to nail, initial encounter: Secondary | ICD-10-CM | POA: Diagnosis not present

## 2021-01-16 MED ORDER — MUPIROCIN 2 % EX OINT: 1.0000 "application " | TOPICAL_OINTMENT | Freq: Every day | CUTANEOUS | 2 refills | Status: DC

## 2021-01-16 NOTE — Progress Notes (Signed)
  Subjective:  Patient ID: Jon Adkins, male    DOB: December 23, 1975,  MRN: [id number]  Chief Complaint  Patient presents with   Foot Ulcer     for wound care, suture removal    45 y.o. male presents with the above complaint. History confirmed with patient.  Still painful but overall continues to do well has been using mupirocin ointment  Objective:  Physical Exam: warm, good capillary refill, no trophic changes or ulcerative lesions, normal DP and PT pulses, and normal sensory exam. Left Foot: Nailbed healing well there are sutures intact that repair of the laceration of the distal hallux, no signs of infections improvement a bit since last visit there is a granular wound bed   Radiographs: I reviewed his radiographs from the emergency room there is avulsion of the distal tuft of the distal phalanx Assessment:   1. Laceration of left great toe with damage to nail, foreign body presence unspecified, initial encounter   2. Open displaced fracture of distal phalanx of left great toe, initial encounter      Plan:  Patient was evaluated and treated and all questions answered.  Overall improving quite a bit.  I removed his sutures today and would like him to continue local wound care with mupirocin ointment.  I would keep him out of work for another 3 weeks.  He can transition out of the postop shoe to an open toed regular shoe, I think close shoes will still be difficult for now  Return in about 3 weeks (around 02/06/2021) for wound care.

## 2021-02-11 ENCOUNTER — Ambulatory Visit (INDEPENDENT_AMBULATORY_CARE_PROVIDER_SITE_OTHER): Payer: Self-pay | Admitting: Podiatry

## 2021-02-11 ENCOUNTER — Other Ambulatory Visit: Payer: Self-pay

## 2021-02-11 ENCOUNTER — Encounter: Payer: Self-pay | Admitting: Podiatry

## 2021-02-11 DIAGNOSIS — S91212D Laceration without foreign body of left great toe with damage to nail, subsequent encounter: Secondary | ICD-10-CM

## 2021-02-11 DIAGNOSIS — M67472 Ganglion, left ankle and foot: Secondary | ICD-10-CM

## 2021-02-11 NOTE — Progress Notes (Signed)
  Subjective:  Patient ID: Jon Adkins, male    DOB: 1975-05-02,  MRN: [id number]  Chief Complaint  Patient presents with   Foot Ulcer     wound care left great toe    45 y.o. male presents with the above complaint. History confirmed with patient.  Still painful has not been able to wear his steel toe boots yet.  He also has a painful bump on the top of the foot  Objective:  Physical Exam: warm, good capillary refill, no trophic changes or ulcerative lesions, normal DP and PT pulses, and normal sensory exam. Left Foot: Nailbed healing well there are sutures intact that repair of the laceration of the distal hallux, no signs of infections improvement a bit since last visit there is a granular wound bed Dorsal midfoot there is a palpable bone spur and fluctuant cyst overlying this  Radiographs: I reviewed his radiographs from the emergency room there is avulsion of the distal tuft of the distal phalanx Assessment:   1. Laceration of left great toe with damage to nail, foreign body presence unspecified, subsequent encounter      Plan:  Patient was evaluated and treated and all questions answered.  Overall improving quite a bit.  Leave open to air.  I would keep him out of work for another 2 weeks.  Hopefully can transition back to regular close shoes and his steel toe boots at that point.  Discussed etiology treatment options ganglion cyst including aspiration injection and excision.  He will continue to monitor.  I recommend Voltaren gel he will try this.  Return in about 2 weeks (around 02/25/2021) for wound care.

## 2021-02-11 NOTE — Patient Instructions (Signed)
Look for Voltaren gel at the pharmacy over the counter or online (also known as diclofenac 1% gel). Apply to the painful areas 3-4x daily with the supplied dosing card. Allow to dry for 10 minutes before going into socks/shoes  

## 2021-02-27 ENCOUNTER — Encounter: Payer: Self-pay | Admitting: *Deleted

## 2021-02-27 ENCOUNTER — Other Ambulatory Visit: Payer: Self-pay

## 2021-02-27 ENCOUNTER — Ambulatory Visit (INDEPENDENT_AMBULATORY_CARE_PROVIDER_SITE_OTHER): Payer: No Typology Code available for payment source | Admitting: Podiatry

## 2021-02-27 DIAGNOSIS — S91212D Laceration without foreign body of left great toe with damage to nail, subsequent encounter: Secondary | ICD-10-CM | POA: Diagnosis not present

## 2021-02-27 MED ORDER — MUPIROCIN 2 % EX OINT: 1.0000 "application " | TOPICAL_OINTMENT | Freq: Every day | CUTANEOUS | 2 refills | Status: AC

## 2021-02-27 NOTE — Progress Notes (Signed)
  Subjective:  Patient ID: Jon Adkins, male    DOB: 01/12/1976,  MRN: [id number]  Chief Complaint  Patient presents with   Foot Ulcer    for wound care left great toe    45 y.o. male presents with the above complaint. History confirmed with patient.  Still painful has not been able to wear his steel toe boots yet.  Ready to go back to work but he is concerned about possible infection if he was back now  Objective:  Physical Exam: warm, good capillary refill, no trophic changes or ulcerative lesions, normal DP and PT pulses, and normal sensory exam. Left Foot: Wound is nearly fully healed the proximal nail fold still has a small area that has some bloody drainage  Radiographs: I reviewed his radiographs from the emergency room there is avulsion of the distal tuft of the distal phalanx Assessment:   1. Laceration of left great toe with damage to nail, foreign body presence unspecified, subsequent encounter      Plan:  Patient was evaluated and treated and all questions answered.  Continues to improve it is probably 90% healed at this point.  There is a small area that is open proximally.  I think again today I would keep him out of work for another 2 weeks.  Hopefully can transition back to regular close shoes and his steel toe boots at that point. Discussed with him ultimately he may need to find a job where he does not have to wear steel toes   Return in about 2 weeks (around 03/13/2021) for wound care.

## 2021-03-13 ENCOUNTER — Other Ambulatory Visit: Payer: Self-pay

## 2021-03-13 ENCOUNTER — Encounter: Payer: Self-pay | Admitting: Podiatry

## 2021-03-13 ENCOUNTER — Ambulatory Visit (INDEPENDENT_AMBULATORY_CARE_PROVIDER_SITE_OTHER): Payer: No Typology Code available for payment source | Admitting: Podiatry

## 2021-03-13 DIAGNOSIS — S91212D Laceration without foreign body of left great toe with damage to nail, subsequent encounter: Secondary | ICD-10-CM

## 2021-03-14 ENCOUNTER — Encounter: Payer: Self-pay | Admitting: Podiatry

## 2021-03-14 NOTE — Progress Notes (Signed)
°  Subjective:  Patient ID: Jon Adkins, male    DOB: December 14, 1975,  MRN: [id number]  Chief Complaint  Patient presents with   laceration    Follow up left great toe    45 y.o. male presents with the above complaint. History confirmed with patient.  Thinks is doing better  Objective:  Physical Exam: warm, good capillary refill, no trophic changes or ulcerative lesions, normal DP and PT pulses, and normal sensory exam. Left Foot: No drainage there is dystrophy of the nail plate  Radiographs: I reviewed his radiographs from the emergency room there is avulsion of the distal tuft of the distal phalanx Assessment:   1. Laceration of left great toe with damage to nail, foreign body presence unspecified, subsequent encounter       Plan:  Patient was evaluated and treated and all questions answered.  Doing much better he has residual tenderness.  I think he can safely return to work at this point and I wrote him a work note to release him.  Silicone toe caps were dispensed to offload the area when he has back in a surgical shoe.  Advised he may need nail avulsion at some point if this becomes painful or discomfort  Return if symptoms worsen or fail to improve.

## 2021-10-12 IMAGING — CR DG FOOT COMPLETE 3+V*L*
1 series · 3 of 3 positions shown · non-contrast
Comparison: None.

CLINICAL DATA: Pt to ED POV c/o from foot injury from work that
happened around [DATE] this morning. Pt states he works for a trucking
company and while he was at work a pallet Solimar Keel over his L Big
toe. Pt has laceration across L big toe. Pt is able to move foot and
can feel all the way down to injury.Pt is diabetic

EXAM:
LEFT FOOT - COMPLETE 3 VIEW

[Series 1: dg foot complete left · 0.14mm/px · 3 of 3 slices shown]
[im 1/3]
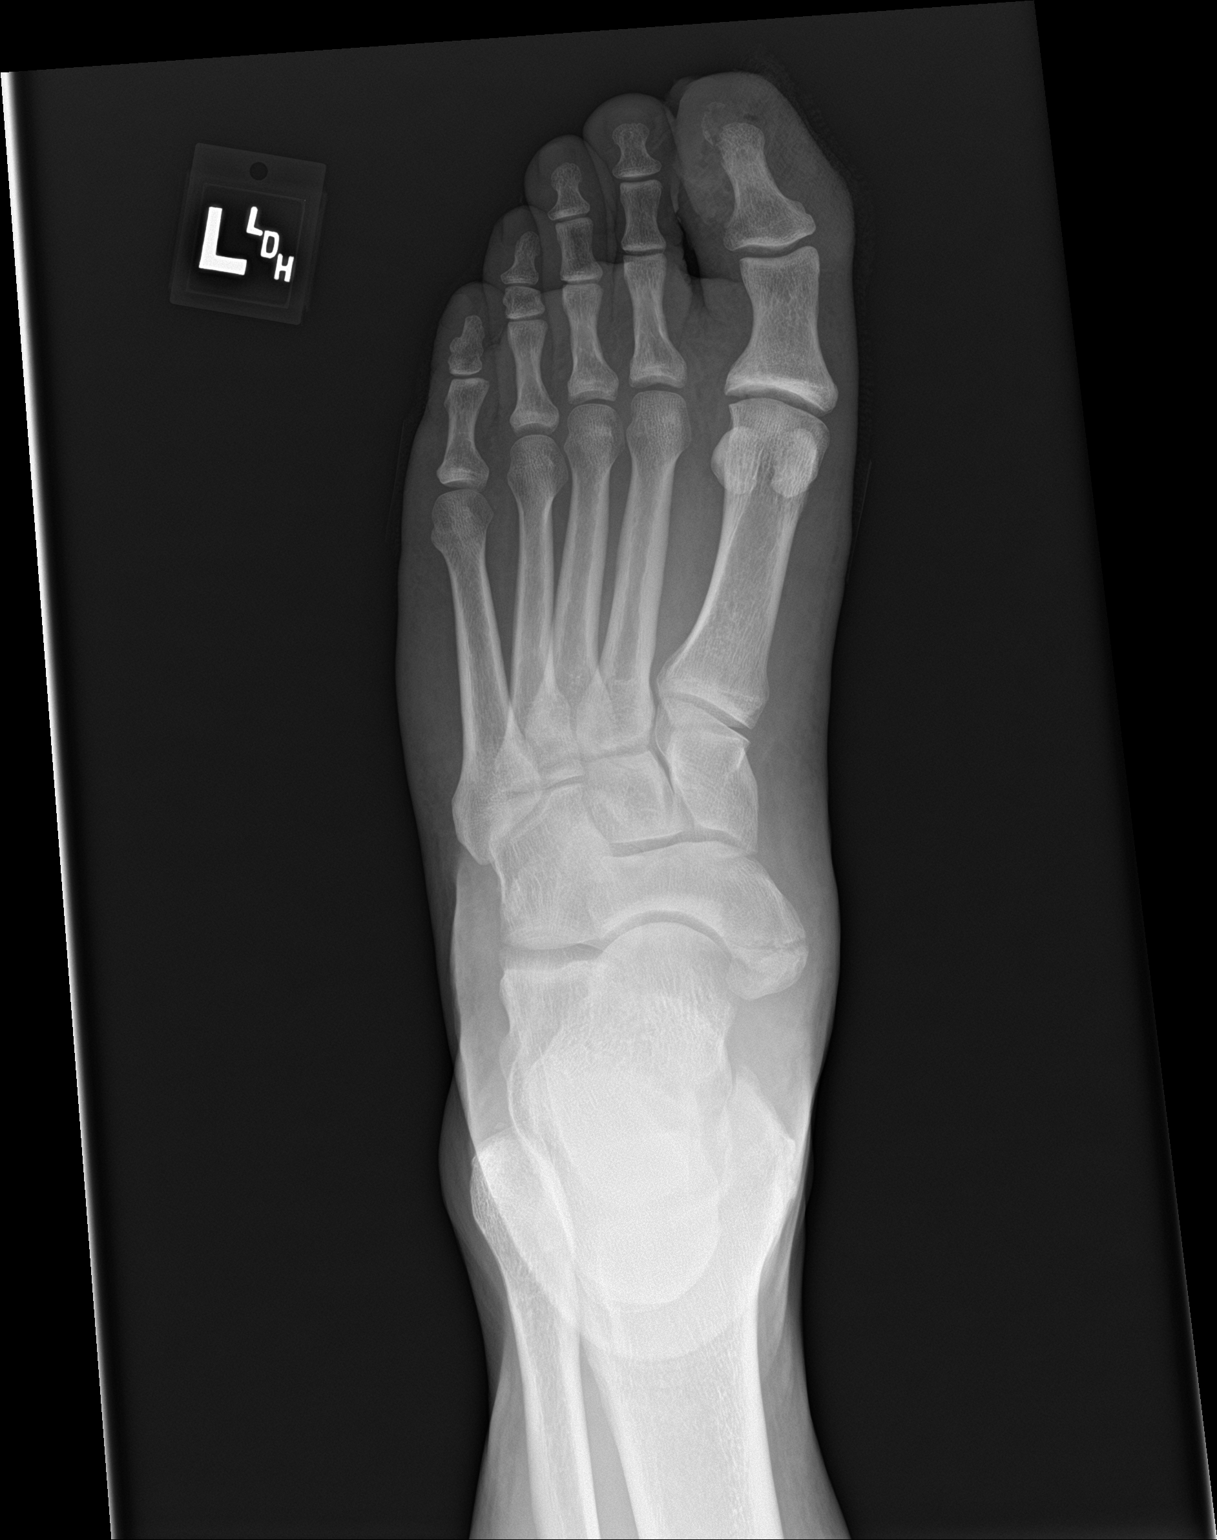
[im 2/3]
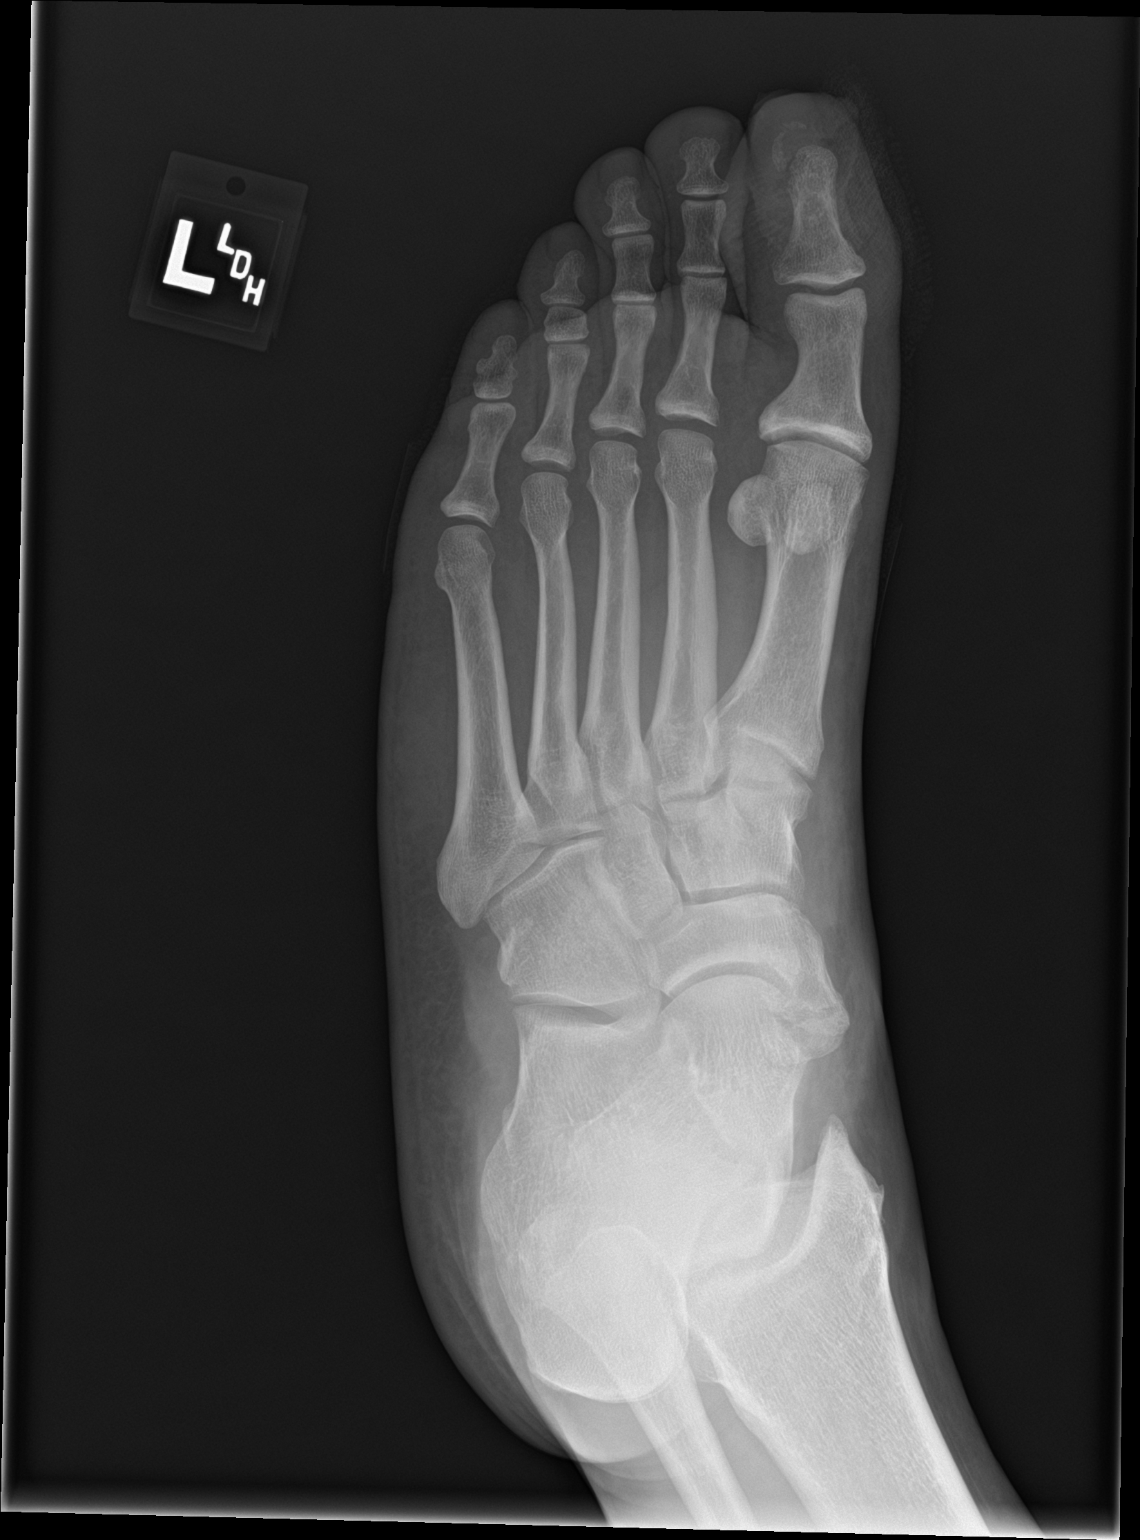
[im 3/3]
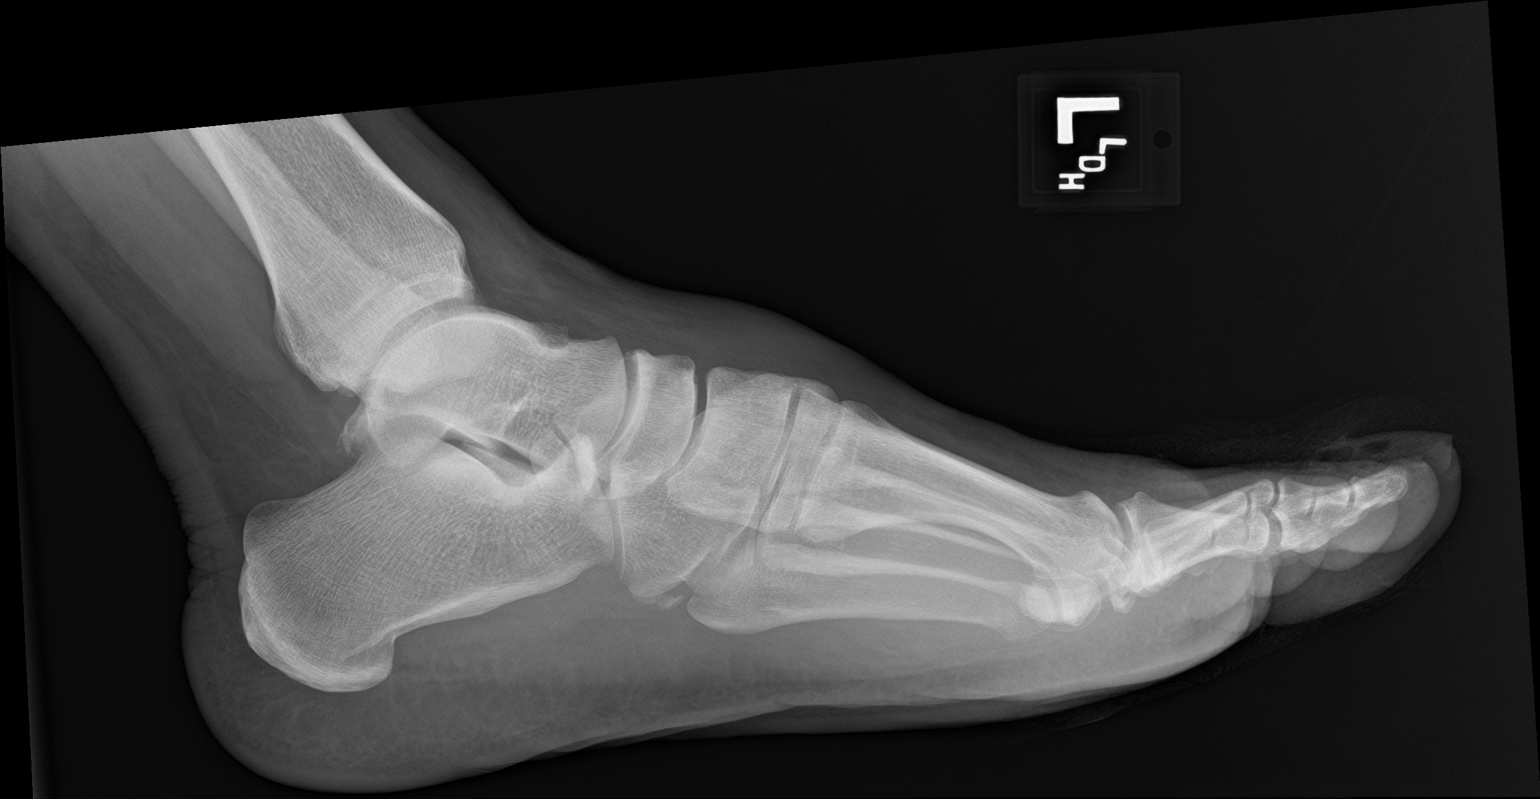

[3 of 3 positions shown; findings below may reference images not displayed]

FINDINGS: Comminuted, mildly displaced fracture from the dorsal lateral margin
of the distal tuft of the great toe, with an overlying soft tissue
injury.

No other fractures.  No bone lesions.

Joints are normally spaced and aligned.

No radiopaque foreign bodies.
IMPRESSION: 1. Comminuted mildly displaced fracture of the distal tuft of the
left great toe associated with a soft tissue injury. No radiopaque
foreign body.

## 2022-01-17 DIAGNOSIS — K635 Polyp of colon: Secondary | ICD-10-CM

## 2022-01-17 DIAGNOSIS — Z1211 Encounter for screening for malignant neoplasm of colon: Secondary | ICD-10-CM
